# Patient Record
Sex: Female | Born: 1965
Health system: Southern US, Community
[De-identification: ages and names within clinical notes are randomized; demographics above are authoritative.]

---

## 1999-08-09 ENCOUNTER — Other Ambulatory Visit: Admission: RE | Admit: 1999-08-09 | Discharge: 1999-08-09 | Payer: Self-pay | Admitting: Obstetrics and Gynecology

## 2000-05-29 ENCOUNTER — Encounter: Admission: RE | Admit: 2000-05-29 | Discharge: 2000-05-29 | Payer: Self-pay | Admitting: Obstetrics and Gynecology

## 2000-05-29 ENCOUNTER — Encounter: Payer: Self-pay | Admitting: Obstetrics and Gynecology

## 2000-08-28 ENCOUNTER — Other Ambulatory Visit: Admission: RE | Admit: 2000-08-28 | Discharge: 2000-08-28 | Payer: Self-pay | Admitting: Obstetrics and Gynecology

## 2000-08-29 ENCOUNTER — Encounter: Admission: RE | Admit: 2000-08-29 | Discharge: 2000-08-29 | Payer: Self-pay | Admitting: Internal Medicine

## 2000-08-29 ENCOUNTER — Encounter: Payer: Self-pay | Admitting: Internal Medicine

## 2000-10-21 ENCOUNTER — Ambulatory Visit (HOSPITAL_COMMUNITY): Admission: RE | Admit: 2000-10-21 | Discharge: 2000-10-21 | Payer: Self-pay | Admitting: Obstetrics and Gynecology

## 2000-10-21 ENCOUNTER — Encounter (INDEPENDENT_AMBULATORY_CARE_PROVIDER_SITE_OTHER): Payer: Self-pay

## 2001-03-30 ENCOUNTER — Encounter: Payer: Self-pay | Admitting: Obstetrics and Gynecology

## 2001-03-30 ENCOUNTER — Encounter: Admission: RE | Admit: 2001-03-30 | Discharge: 2001-03-30 | Payer: Self-pay | Admitting: Obstetrics and Gynecology

## 2001-09-07 ENCOUNTER — Other Ambulatory Visit: Admission: RE | Admit: 2001-09-07 | Discharge: 2001-09-07 | Payer: Self-pay | Admitting: Obstetrics and Gynecology

## 2002-05-22 ENCOUNTER — Ambulatory Visit (HOSPITAL_COMMUNITY): Admission: RE | Admit: 2002-05-22 | Discharge: 2002-05-22 | Payer: Self-pay | Admitting: Obstetrics and Gynecology

## 2002-05-22 ENCOUNTER — Encounter (INDEPENDENT_AMBULATORY_CARE_PROVIDER_SITE_OTHER): Payer: Self-pay

## 2002-07-01 ENCOUNTER — Encounter (INDEPENDENT_AMBULATORY_CARE_PROVIDER_SITE_OTHER): Payer: Self-pay | Admitting: Specialist

## 2002-07-01 ENCOUNTER — Inpatient Hospital Stay (HOSPITAL_COMMUNITY): Admission: RE | Admit: 2002-07-01 | Discharge: 2002-07-03 | Payer: Self-pay | Admitting: Obstetrics and Gynecology

## 2002-10-11 ENCOUNTER — Other Ambulatory Visit: Admission: RE | Admit: 2002-10-11 | Discharge: 2002-10-11 | Payer: Self-pay | Admitting: Obstetrics and Gynecology

## 2005-05-26 ENCOUNTER — Encounter: Admission: RE | Admit: 2005-05-26 | Discharge: 2005-05-26 | Payer: Self-pay | Admitting: Internal Medicine

## 2005-10-24 HISTORY — PX: BREAST BIOPSY: SHX20

## 2006-05-30 ENCOUNTER — Encounter: Admission: RE | Admit: 2006-05-30 | Discharge: 2006-05-30 | Payer: Self-pay | Admitting: Internal Medicine

## 2006-06-02 ENCOUNTER — Encounter: Admission: RE | Admit: 2006-06-02 | Discharge: 2006-06-02 | Payer: Self-pay | Admitting: Internal Medicine

## 2006-06-19 ENCOUNTER — Encounter (INDEPENDENT_AMBULATORY_CARE_PROVIDER_SITE_OTHER): Payer: Self-pay | Admitting: *Deleted

## 2006-06-19 ENCOUNTER — Encounter: Admission: RE | Admit: 2006-06-19 | Discharge: 2006-06-19 | Payer: Self-pay | Admitting: Internal Medicine

## 2007-06-04 ENCOUNTER — Encounter: Admission: RE | Admit: 2007-06-04 | Discharge: 2007-06-04 | Payer: Self-pay | Admitting: Internal Medicine

## 2007-11-22 ENCOUNTER — Emergency Department (HOSPITAL_COMMUNITY): Admission: EM | Admit: 2007-11-22 | Discharge: 2007-11-22 | Payer: Self-pay | Admitting: Emergency Medicine

## 2008-06-04 ENCOUNTER — Encounter: Admission: RE | Admit: 2008-06-04 | Discharge: 2008-06-04 | Payer: Self-pay | Admitting: Internal Medicine

## 2009-06-05 ENCOUNTER — Encounter: Admission: RE | Admit: 2009-06-05 | Discharge: 2009-06-05 | Payer: Self-pay | Admitting: Internal Medicine

## 2010-06-07 ENCOUNTER — Encounter: Admission: RE | Admit: 2010-06-07 | Discharge: 2010-06-07 | Payer: Self-pay | Admitting: Internal Medicine

## 2011-03-11 NOTE — Discharge Summary (Signed)
   NAME:  Samantha Cardenas, Samantha Cardenas                      ACCOUNT NO.:  [id number]   MEDICAL RECORD NO.:  [id number]                   PATIENT TYPE:  INP   LOCATION:  9327                                 FACILITY:  [facility]   PHYSICIAN:  Lenoard Aden, M.D.             DATE OF BIRTH:  1965/10/29   DATE OF ADMISSION:  07/01/2002  DATE OF DISCHARGE:  07/03/2002                                 DISCHARGE SUMMARY   The patient underwent uncomplicated abdominal myomectomy on July 01, 2002.  Postoperative hemoglobin stable at 9.7.  The patient remained  afebrile.  Blood pressure within normal limits.  She was discharged to home  on postoperative day number two.  Discharge teaching done.  Tylox number 30  and nonsteroidal anti-inflammatories recommended for discharge pain relief.  She is given precautions and told to follow up in the office in two days for  staple removal.                                               Lenoard Aden, M.D.    RJT/MEDQ  D:  07/03/2002  T:  07/03/2002  Job:  [id number]

## 2011-03-11 NOTE — Op Note (Signed)
   NAME:  Samantha Cardenas, Samantha Cardenas                      ACCOUNT NO.:  [id number]   MEDICAL RECORD NO.:  [id number]                   PATIENT TYPE:  AMB   LOCATION:  SDC                                  FACILITY:  [facility]   PHYSICIAN:  Lenoard Aden, M.D.             DATE OF BIRTH:  1966/08/15   DATE OF PROCEDURE:  05/22/2002  DATE OF DISCHARGE:  05/22/2002                                 OPERATIVE REPORT   PREOPERATIVE DIAGNOSES:  1. Missed abortion and embryonic fetal demise.  2. Multiple uterine fibroids.   POSTOPERATIVE DIAGNOSES:  1. Missed abortion and embryonic fetal demise.  2. Multiple uterine fibroids.   PROCEDURE:  Suction dilatation and evacuation.   SURGEON:  Lenoard Aden, M.D.   ANESTHESIA:  General by Raul Del, M.D.   ESTIMATED BLOOD LOSS:  Less than 50 cc.   COMPLICATIONS:  None.   COUNTS:  Needle counts correct.   DISPOSITION:  Patient to recovery in good condition.   SPECIMENS:  Products of conception sent to pathology.   DESCRIPTION OF PROCEDURE:  After being apprised of the risks of anesthesia,  infection, bleeding, uterine perforation and need for repair, the patient  was brought to the operating room, where she was administered a general  anesthetic without complications, prepped and draped in the usual sterile  fashion and catheterized until the bladder was empty.  Exam under anesthesia  revealed a bulky 10-week size uterus with anterior irregularities consistent  with fibroids, no evidence of masses.  After achieving adequate anesthesia,  a paracervical block was placed using 20 cc of dilute Xylocaine solution.  The uterus sounds to approximately 14 cm, dilated easily up to a #24 Pratt  dilator, and an 8 mm suction curette placed, suctioned revealing adequate  tissue consistent with products of conception, which is noted.  Good  hemostasis was achieved.  Blunt curettage in a four-quadrant method and  repeat suction and curettage reveals the  cavity to be empty.  Good  hemostasis is achieved with additional bimanual massage.  All instruments  are removed from the vagina.  The patient is awakened, transferred to  recovery in good condition.                                                Lenoard Aden, M.D.   RJT/MEDQ  D:  05/22/2002  T:  05/28/2002  Job:  [id number]

## 2011-03-11 NOTE — H&P (Signed)
   NAME:  Samantha Cardenas, Samantha Cardenas                      ACCOUNT NO.:  [id number]   MEDICAL RECORD NO.:  [id number]                   PATIENT TYPE:  AMB   LOCATION:  SDC                                  [facility]ILITY[facility]  [facility]   PHYSICIAN:  Olivia Mackie, M.D.                DATE OF BIRTH:  [birth year]   DATE OF ADMISSION:  05/22/2002  DATE OF DISCHARGE:                                HISTORY & PHYSICAL   CHIEF COMPLAINT:  Missed abortion.   HISTORY OF PRESENT ILLNESS:  The patient is a 45 year old African American  female G3, P1, with missed AB, six week intrauterine sac for definitive  therapy.   PAST MEDICAL HISTORY:  Remarkable for hypertension, currently on Aldomet 250  mg twice a day.  She has no history of any other surgical hospitalizations.  History of one uncomplicated abortion and one uncomplicated vaginal birth.   ALLERGIES:  ENVIRONMENTAL in addition to ATENOLOL and LABETALOL.   FAMILY HISTORY:  Diabetes, hypertension, and breasts cancer.   PHYSICAL EXAMINATION:  GENERAL:  She is a well-developed and well-nourished  [place] female in no apparent distress.  HEENT:  Normal.  LUNGS:  Clear.  HEART:  Regular.  ABDOMEN:  Soft, gravid, and nontender.  PELVIC:  Uterus is anteflexed, irregular, and 10 weeks size.  No adnexal  masses.  EXTREMITIES:  There are no cords.  NEUROLOGIC:  Exam is nonfocal.   IMPRESSION:  Missed abortion with subnormal rising human chorionic  gonadotropin titers.   PLAN:  Proceed with suction D&E.  Risks of anesthesia, infection, bleeding,  injury to abdominal organs, and need for repair were discussed at length  with complications to include bowel, bladder, and __________ .  The patient  acknowledges and wishes to proceed.                                                Olivia Mackie, M.D.    RT/MEDQ  D:  05/22/2002  T:  05/23/2002  Job:  [id number] [id number]

## 2011-03-11 NOTE — H&P (Signed)
[facility]  Patient:    Samantha Cardenas, Samantha Cardenas                   MRN: [id number] Adm. Date:  [id number] Attending:  Lenoard Aden CC:         [facility]   History and Physical  CHIEF COMPLAINT:              Menometrorrhagia, questionable submucous.  HISTORY OF PRESENT ILLNESS:   The patient is a 45 year old black female, G1, P1, with known uterine fibroids and questionable submucous fibroid.  She presents for definitive therapy.  ALLERGIES:  No known drug allergies.  MEDICATIONS: 1. Micardis HCT. 2. Allegra. 3. Calcium. 4. Levbid as need.  PAST MEDICAL HISTORY:         Hypertension.  Known irritable bowel syndrome.  SURGICAL HISTORY:             Carpal tunnel release.  Vaginal delivery.  SOCIAL HISTORY:               She denies domestic or physical violence. Social history otherwise noncontributory.  FAMILY HISTORY:               Noncontributory.  PHYSICAL EXAMINATION:  GENERAL:                      A well-developed, well-nourished black female. No apparent distress.  HEENT:                        Normal.  LUNGS:                        Clear.  HEART:                        Regular rhythm.  ABDOMEN:                      Soft, obese and nontender.  PELVIC:                       Uterus is slightly enlarged, anteflexed.  No adnexal masses are appreciated.  EXTREMITIES:                  Reveal no cords.  NEUROLOGIC:                   Nonfocal.  IMPRESSION:                   Menometrorrhagia with probable submucous fibroid.  PLAN:                         Proceed with diagnostic hysteroscopy and resectoscope.  D&C.  The risks of anesthesia, infection, bleeding, uterine perforation, need for repair was discussed.  The patient acknowledges and desires to proceed. DD:  10/21/00 TD:  10/21/00 Job: [id number]

## 2011-03-11 NOTE — Op Note (Signed)
[facility]  Patient:    Samantha Cardenas, Samantha Cardenas                   MRN: [id number] Proc. Date: 10/21/00 Adm. Date:  [id number] Attending:  Lenoard Aden CC:         [facility]   Operative Report  PREOPERATIVE DIAGNOSIS:       Menometrorrhagia with probable submucous fibroid.  POSTOPERATIVE DIAGNOSIS:      Menometrorrhagia with probable submucous fibroid.  PROCEDURES:                   1. Diagnosed hysteroscopy.                               2. Resectoscopic myomectomy.                               3. Dilation and curettage.  SURGEON:                      Lenoard Aden, M.D.  ANESTHESIA:                   General.  ESTIMATED BLOOD LOSS:         less than 50 cc.  FLUID DEFICIT:                None.  COMPLICATIONS:                None.  SPECIMENS:                    Submucous fibroid and endometrial curettings to pathology.  DISPOSITION:                  Patient to recovery in good condition.  BRIEF OPERATIVE NOTE:         After being apprised of the risks of anesthesia, infection, bleeding and uterine perforation with the need for repair, the patient was brought to the operating room, where she was administered general anesthetic, prepped and draped in the usual sterile fashion.  She catheterized until her bladder was empty.  Her feet were placed in the [place] stirrups. Examination under anesthesia revealed a boggy, enlarged, anteflexed uterus and no adnexal masses.  A weighted speculum was placed. Diluted Pitressin solution was placed at 3 and 9 oclock at the cervicovaginal junction and the cervix dilated easily up to a #33 Pratt dilator.  The hysteroscope was placed. Diffuse endometrial thickening on the anterior and posterior wall with a bulging along the anterior fundal area was noted.  A double edged loop was used to resect the posterior wall, anterior wall and the submucous fibroid, which appeared to be in the anterior  fundal area.  Bilateral tubal ostial appeared normal.  Good hemostasis was noted during the procedure.  Fluid deficit, as noted, was 0.  The instruments were removed.  D&C was performed with a sharp curet.  Revisualization revealed an empty cavity.  After this, all instruments were removed.  The patient tolerated the procedure well and was transferred to recovery in good condition. DD:  10/21/00 TD:  10/21/00 Job: [id number]

## 2011-03-11 NOTE — H&P (Signed)
   NAME:  Samantha Cardenas, Samantha Cardenas                      ACCOUNT NO.:  [id number]   MEDICAL RECORD NO.:  [id number]                   PATIENT TYPE:  INP   LOCATION:  NA                                   [facility]ACILITY:  [facility]   PHYSICIAN:  Lenoard Aden, M.D.             DATE OF BIRTH:  27-Jun-1966   DATE OF ADMISSION:  07/01/2002  DATE OF DISCHARGE:                                HISTORY & PHYSICAL   CHIEF COMPLAINT:  Symptomatic uterine fibroids, dysmenorrhea and pelvic  pain.   HISTORY OF PRESENT ILLNESS:  The patient is a 45 year old African-American  female G3, P1, status post history of recurrent pregnancy loss who presents  with symptomatic enlarging uterine fibroids.  Past ultrasound reveals a 7 cm  fundal bordering on some mucosal fibroid with abnormal saline fundal  hysterography followed by pregnancy with secondary loss.   PAST OBSTETRICAL AND GYNECOLOGICAL HISTORY:  The patient's past medical  history is significant for a submucous fibroid with resection and  resectoscopic myomectomy in December 2001.  Previously she has had one  uncomplicated abortion and one uncomplicated vaginal delivery.   MEDICATIONS:  Include previous Micardis for hypertension, Claritin and  prenatal p.r.n.   ALLERGIES:  She is allergic to pollen.   PAST MEDICAL HISTORY:  Remarkable for history of hypertension for which she  has previously been on Micardis and now has switched to Aldomet as a chronic  medication due to preconceptional counseling.   PHYSICAL EXAMINATION:   GENERAL:  On physical exam she is a well-developed, well-nourished African-  American female in no apparent distress.   HEENT:  Normal.   LUNGS:  Clear.   HEART:  Regular rate and rhythm.   ABDOMEN:  Soft and nontender.   PELVIC:  Exam reveals an anteflexed 12-13 cm size uterus.  No adnexal masses  as confirmed by ultrasound.   EXTREMITIES:  Reveal no cords.   NEUROLOGIC:  Exam is nonfocal.   IMPRESSION:  1. Recurrent  pregnancy loss.  2. Symptomatic dysmenorrhea and menorrhagia.  3. Pelvic pain.  4. Known enlarging uterine fibroid demonstrated.   PLAN:  The plan is to proceed with exploratory laparotomy and abdominal  myomectomy.  The risks of  anesthesia, infection, bleeding, and ability to  prevent inability in future conception discussed.  A small possibility of  causing infertility from adhesions related  to the procedure was done.  The  patient acknowledges and desires to proceed.                                                  Lenoard Aden, M.D.    RJT/MEDQ  D:  06/30/2002  T:  07/01/2002  Job:  [id number]   cc:   [facility]

## 2011-03-11 NOTE — Op Note (Signed)
NAME:  MATTINGLY, FOUNTAINE                      ACCOUNT NO.:  [id number]   MEDICAL RECORD NO.:  [id number]                   PATIENT TYPE:  INP   LOCATION:  9327                                 [facility]ACILITY:  [facility]   PHYSICIAN:  Fatih Stalvey DICTATOR                    DATE OF BIRTH:  Sep 10, 1966   DATE OF PROCEDURE:  07/01/2002  DATE OF DISCHARGE:                                 OPERATIVE REPORT   PREOPERATIVE DIAGNOSES:  1. Dysmenorrhea.  2. Symptomatic fibroids.   POSTOPERATIVE DIAGNOSES:  1. Dysmenorrhea.  2. Symptomatic fibroids.   PROCEDURES:  1. Exploratory laparotomy.  2. Myomectomy.   SURGEON:  Lenoard Aden, M.D.   ASSISTANT:  Pershing Cox, M.D.   ANESTHESIA:  General by Quillian Quince, M.D.   ESTIMATED BLOOD LOSS:  75 cc.   COMPLICATIONS:  None.   INSTRUMENT COUNT:  Correct.   DISPOSITION:  The patient in recovery in good condition.   SPECIMENS:  Uterine fibroid, to pathology.   BRIEF OPERATIVE NOTE:  After being apprised of the risks of anesthesia,  infection, bleeding, possible need for hysterectomy due to uncontrolled  intraoperative bleeding upon removal of fibroid, possible recurrent risk of  fibroid, possible infertility from secondary adhesions, the patient was  brought to the operating room where she was administered general anesthetic  without complications, prepped and draped in the usual sterile fashion.  Please note that all consents were signed and explained prior to the  procedure.  At this time, after achieving adequate anesthesia, exam under  anesthesia reveals a bulky, anteflexed, 10-12 weeks' size uterus.  No  adnexal masses.  A HUMI was placed per vagina and hooked up to dye in a  sterile fashion, indigo carmine.  At this time, a Pfannenstiel skin incision  made with a scalpel which was carried down to the fascia then nicked in the  midline and opened transversely using Mayo scissors.  The rectus muscles  dissected sharply in the  midline.  The peritoneum entered sharply.  The  bowel was packed out of the operative field.  Pulsatile aorta was noted, no  adenopathy.  The appendix, liver, and gallbladder are not visualized.  Normal tubes and ovaries are visualized.  A anterior,  bulging, consistent  with a intramural fibroid is noted.  An anterior transverse fundal incision  is made after dilute Pitressin solution, 20 and 30 cc, is placed.  Capsule  of the fibroid is difficult to identify and is difficult to dissect off of  the uterine capsule.  Sharp dissection is undertaken using Metzenbaum  scissors.  Good hemostasis during dissection is noted.  The fibroid is  elevated using towel clips.  After achieving dissection around what appears  to be half way around this poorly defined fibroid, the decision is made to  bisect the fibroid so as to identify where it comes in contact with the  endometrial cavity because  on saline sonohysterogram, there is a suspicion  that it enters the endometrial cavity.  Therefore, bisecting the fibroid  down to its base reveals where it attaches into the endometrial cavity.  The  fibroid is then sharply dissected off its caudad point and the endometrial  cavity is entered.  Indigo carmine blue is then injected through the HUMI to  identify the endometrial cavity defect which is then closed using a 3-0  Vicryl suture in interrupted fashion.  The fibroid having been removed, the  base is hemostatic, and is imbricated and closed using multiple interrupted  3-0 Vicryl sutures.  Good hemostasis is noted.  A baseball stitch is placed  in three locations to close the defect using a 4-0 Monocryl suture.  Good  hemostasis achieved.  Copious irrigation with 500 cc is provided.  Interceed  is placed over the anterior wall defect without difficulty.  Of note, after  closure of the defect, the bladder flap appears to be elevated and adherent  to the lower part of the defect, which will be of note in  future need for C-  sections.  At this time, having accomplished good irrigation and good  hemostasis, the fascia is closed using a 0 Vicryl in continuous running  fashion.  The skin closed using staples.  The patient tolerated the  procedure well and is transferred to recovery in good condition.                                                Nalda Shackleford DICTATOR    DD/MEDQ  D:  07/01/2002  T:  07/01/2002  Job:  [id number]

## 2011-03-31 ENCOUNTER — Other Ambulatory Visit: Payer: Self-pay | Admitting: Internal Medicine

## 2011-03-31 DIAGNOSIS — Z1231 Encounter for screening mammogram for malignant neoplasm of breast: Secondary | ICD-10-CM

## 2011-06-09 ENCOUNTER — Ambulatory Visit
Admission: RE | Admit: 2011-06-09 | Discharge: 2011-06-09 | Disposition: A | Discharge status: Home / Self Care | Payer: 59 | Source: Ambulatory Visit | Attending: Internal Medicine | Admitting: Internal Medicine

## 2011-06-09 DIAGNOSIS — Z1231 Encounter for screening mammogram for malignant neoplasm of breast: Secondary | ICD-10-CM

## 2013-05-21 ENCOUNTER — Other Ambulatory Visit: Payer: Self-pay

## 2013-05-21 DIAGNOSIS — Z1231 Encounter for screening mammogram for malignant neoplasm of breast: Secondary | ICD-10-CM

## 2013-06-04 ENCOUNTER — Ambulatory Visit: Payer: 59

## 2013-06-11 ENCOUNTER — Ambulatory Visit
Admission: RE | Admit: 2013-06-11 | Discharge: 2013-06-11 | Disposition: A | Discharge status: Home / Self Care | Payer: BC Managed Care – PPO | Source: Ambulatory Visit

## 2013-06-11 DIAGNOSIS — Z1231 Encounter for screening mammogram for malignant neoplasm of breast: Secondary | ICD-10-CM

## 2014-06-02 ENCOUNTER — Other Ambulatory Visit: Payer: Self-pay

## 2014-06-02 DIAGNOSIS — Z1231 Encounter for screening mammogram for malignant neoplasm of breast: Secondary | ICD-10-CM

## 2014-06-12 ENCOUNTER — Ambulatory Visit
Admission: RE | Admit: 2014-06-12 | Discharge: 2014-06-12 | Disposition: A | Discharge status: Home / Self Care | Payer: PRIVATE HEALTH INSURANCE | Source: Ambulatory Visit

## 2014-06-12 ENCOUNTER — Encounter (INDEPENDENT_AMBULATORY_CARE_PROVIDER_SITE_OTHER): Payer: Self-pay

## 2014-06-12 DIAGNOSIS — Z1231 Encounter for screening mammogram for malignant neoplasm of breast: Secondary | ICD-10-CM

## 2014-10-23 DIAGNOSIS — R7309 Other abnormal glucose: Secondary | ICD-10-CM | POA: Insufficient documentation

## 2015-04-30 ENCOUNTER — Other Ambulatory Visit: Payer: Self-pay

## 2015-04-30 DIAGNOSIS — Z1231 Encounter for screening mammogram for malignant neoplasm of breast: Secondary | ICD-10-CM

## 2015-06-15 ENCOUNTER — Ambulatory Visit
Admission: RE | Admit: 2015-06-15 | Discharge: 2015-06-15 | Disposition: A | Discharge status: Home / Self Care | Payer: PRIVATE HEALTH INSURANCE | Source: Ambulatory Visit

## 2015-06-15 ENCOUNTER — Other Ambulatory Visit: Payer: Self-pay

## 2015-06-15 DIAGNOSIS — Z1231 Encounter for screening mammogram for malignant neoplasm of breast: Secondary | ICD-10-CM

## 2015-06-17 ENCOUNTER — Other Ambulatory Visit: Payer: Self-pay | Admitting: Internal Medicine

## 2015-06-17 DIAGNOSIS — R928 Other abnormal and inconclusive findings on diagnostic imaging of breast: Secondary | ICD-10-CM

## 2015-06-23 ENCOUNTER — Ambulatory Visit
Admission: RE | Admit: 2015-06-23 | Discharge: 2015-06-23 | Disposition: A | Discharge status: Home / Self Care | Payer: PRIVATE HEALTH INSURANCE | Source: Ambulatory Visit | Attending: Internal Medicine | Admitting: Internal Medicine

## 2015-06-23 DIAGNOSIS — R928 Other abnormal and inconclusive findings on diagnostic imaging of breast: Secondary | ICD-10-CM

## 2015-12-10 ENCOUNTER — Other Ambulatory Visit: Payer: Self-pay | Admitting: Internal Medicine

## 2015-12-10 DIAGNOSIS — R921 Mammographic calcification found on diagnostic imaging of breast: Secondary | ICD-10-CM

## 2015-12-10 DIAGNOSIS — N6001 Solitary cyst of right breast: Secondary | ICD-10-CM

## 2015-12-16 ENCOUNTER — Ambulatory Visit
Admission: RE | Admit: 2015-12-16 | Discharge: 2015-12-16 | Disposition: A | Discharge status: Home / Self Care | Payer: PRIVATE HEALTH INSURANCE | Source: Ambulatory Visit | Attending: Internal Medicine | Admitting: Internal Medicine

## 2015-12-16 ENCOUNTER — Other Ambulatory Visit: Payer: Self-pay | Admitting: Internal Medicine

## 2015-12-16 DIAGNOSIS — N6001 Solitary cyst of right breast: Secondary | ICD-10-CM

## 2015-12-16 DIAGNOSIS — R921 Mammographic calcification found on diagnostic imaging of breast: Secondary | ICD-10-CM

## 2016-06-15 ENCOUNTER — Ambulatory Visit
Admission: RE | Admit: 2016-06-15 | Discharge: 2016-06-15 | Disposition: A | Discharge status: Home / Self Care | Payer: PRIVATE HEALTH INSURANCE | Source: Ambulatory Visit

## 2016-06-15 DIAGNOSIS — Z1231 Encounter for screening mammogram for malignant neoplasm of breast: Secondary | ICD-10-CM

## 2017-05-19 ENCOUNTER — Other Ambulatory Visit: Payer: Self-pay | Admitting: Internal Medicine

## 2017-05-19 DIAGNOSIS — Z1231 Encounter for screening mammogram for malignant neoplasm of breast: Secondary | ICD-10-CM

## 2017-06-11 DIAGNOSIS — M25462 Effusion, left knee: Secondary | ICD-10-CM | POA: Insufficient documentation

## 2017-06-19 ENCOUNTER — Ambulatory Visit
Admission: RE | Admit: 2017-06-19 | Discharge: 2017-06-19 | Disposition: A | Discharge status: Home / Self Care | Payer: PRIVATE HEALTH INSURANCE | Source: Ambulatory Visit | Attending: Internal Medicine | Admitting: Internal Medicine

## 2017-06-19 DIAGNOSIS — Z1231 Encounter for screening mammogram for malignant neoplasm of breast: Secondary | ICD-10-CM

## 2017-10-09 MED FILL — ROSUVASTATIN CALCIUM 10 MG: 10 | 30 days supply | Qty: 30 | Fill #0

## 2017-10-09 MED FILL — AMLODIPINE-BENAZEPRIL 5-10: 5-10 | 90 days supply | Qty: 90 | Fill #0

## 2017-10-09 MED FILL — TRIAMTERENE-HCTZ 37.5-25 MG: 37.5-25 | 30 days supply | Qty: 30 | Fill #0

## 2017-11-02 DIAGNOSIS — E669 Obesity, unspecified: Secondary | ICD-10-CM | POA: Diagnosis not present

## 2017-11-02 DIAGNOSIS — Z0000 Encounter for general adult medical examination without abnormal findings: Secondary | ICD-10-CM | POA: Diagnosis not present

## 2017-11-02 DIAGNOSIS — I10 Essential (primary) hypertension: Secondary | ICD-10-CM | POA: Diagnosis not present

## 2017-11-02 DIAGNOSIS — E559 Vitamin D deficiency, unspecified: Secondary | ICD-10-CM | POA: Diagnosis not present

## 2017-11-02 DIAGNOSIS — R7309 Other abnormal glucose: Secondary | ICD-10-CM | POA: Diagnosis not present

## 2017-11-02 DIAGNOSIS — E785 Hyperlipidemia, unspecified: Secondary | ICD-10-CM | POA: Diagnosis not present

## 2017-11-09 MED FILL — ROSUVASTATIN CALCIUM 10 MG: 10 | 90 days supply | Qty: 90 | Fill #0

## 2017-11-09 MED FILL — TRIAMTERENE-HCTZ 37.5-25 MG: 37.5-25 | 90 days supply | Qty: 90 | Fill #0

## 2018-01-08 MED FILL — AMLODIPINE-BENAZEPRIL 5-10: 5-10 | 90 days supply | Qty: 90 | Fill #1

## 2018-02-05 MED FILL — TRIAMTERENE-HCTZ 37.5-25 MG: 37.5-25 | 90 days supply | Qty: 90 | Fill #1

## 2018-02-06 MED FILL — ROSUVASTATIN CALCIUM 10 MG: 10 | 90 days supply | Qty: 90 | Fill #1

## 2018-04-06 MED FILL — AMLODIPINE-BENAZEPRIL 5-10: 5-10 | 90 days supply | Qty: 90 | Fill #2

## 2018-05-08 MED FILL — ROSUVASTATIN CALCIUM 10 MG: 10 | 90 days supply | Qty: 90 | Fill #2

## 2018-05-09 DIAGNOSIS — E785 Hyperlipidemia, unspecified: Secondary | ICD-10-CM | POA: Diagnosis not present

## 2018-05-09 DIAGNOSIS — H5203 Hypermetropia, bilateral: Secondary | ICD-10-CM | POA: Diagnosis not present

## 2018-05-09 DIAGNOSIS — L989 Disorder of the skin and subcutaneous tissue, unspecified: Secondary | ICD-10-CM | POA: Diagnosis not present

## 2018-05-09 DIAGNOSIS — H524 Presbyopia: Secondary | ICD-10-CM | POA: Diagnosis not present

## 2018-05-09 DIAGNOSIS — I10 Essential (primary) hypertension: Secondary | ICD-10-CM | POA: Diagnosis not present

## 2018-05-09 DIAGNOSIS — R7309 Other abnormal glucose: Secondary | ICD-10-CM | POA: Diagnosis not present

## 2018-05-09 MED FILL — TRIAMTERENE/HCTZ 37.5/25 TB: 37.5-25 | 90 days supply | Qty: 90 | Fill #0

## 2018-05-16 ENCOUNTER — Other Ambulatory Visit: Payer: Self-pay | Admitting: Internal Medicine

## 2018-05-16 DIAGNOSIS — Z1231 Encounter for screening mammogram for malignant neoplasm of breast: Secondary | ICD-10-CM

## 2018-06-05 DIAGNOSIS — D179 Benign lipomatous neoplasm, unspecified: Secondary | ICD-10-CM | POA: Diagnosis not present

## 2018-06-08 MED FILL — AMLODIPINE-BENAZEPRIL 10-20: 10-20 | 90 days supply | Qty: 90 | Fill #0

## 2018-06-21 ENCOUNTER — Ambulatory Visit
Admission: RE | Admit: 2018-06-21 | Discharge: 2018-06-21 | Disposition: A | Discharge status: Home / Self Care | Payer: 59 | Source: Ambulatory Visit | Attending: Internal Medicine | Admitting: Internal Medicine

## 2018-06-21 DIAGNOSIS — Z1231 Encounter for screening mammogram for malignant neoplasm of breast: Secondary | ICD-10-CM | POA: Diagnosis not present

## 2018-06-28 DIAGNOSIS — D171 Benign lipomatous neoplasm of skin and subcutaneous tissue of trunk: Secondary | ICD-10-CM | POA: Diagnosis not present

## 2018-06-28 DIAGNOSIS — D225 Melanocytic nevi of trunk: Secondary | ICD-10-CM | POA: Diagnosis not present

## 2018-08-06 MED FILL — TRIAMTERENE/HCTZ 37.5/25 TB: 37.5-25 | 90 days supply | Qty: 90 | Fill #1

## 2018-08-06 MED FILL — ROSUVASTATIN CALCIUM 10 MG: 10 | 90 days supply | Qty: 90 | Fill #1

## 2018-09-05 MED FILL — AMLODIPINE-BENAZEPRIL 10-20: 10-20 | 90 days supply | Qty: 90 | Fill #1

## 2018-11-01 MED FILL — TRIAMTERENE/HCTZ 37.5/25 TB: 37.5-25 | 90 days supply | Qty: 90 | Fill #2

## 2018-11-01 MED FILL — ROSUVASTATIN CALCIUM 10 MG: 10 | 90 days supply | Qty: 90 | Fill #0

## 2018-11-05 DIAGNOSIS — G4700 Insomnia, unspecified: Secondary | ICD-10-CM | POA: Diagnosis not present

## 2018-11-05 DIAGNOSIS — I10 Essential (primary) hypertension: Secondary | ICD-10-CM | POA: Diagnosis not present

## 2018-11-05 DIAGNOSIS — Z01419 Encounter for gynecological examination (general) (routine) without abnormal findings: Secondary | ICD-10-CM | POA: Diagnosis not present

## 2018-11-05 DIAGNOSIS — E669 Obesity, unspecified: Secondary | ICD-10-CM | POA: Diagnosis not present

## 2018-11-05 DIAGNOSIS — Z0000 Encounter for general adult medical examination without abnormal findings: Secondary | ICD-10-CM | POA: Diagnosis not present

## 2018-11-05 DIAGNOSIS — R7309 Other abnormal glucose: Secondary | ICD-10-CM | POA: Diagnosis not present

## 2018-11-05 DIAGNOSIS — E559 Vitamin D deficiency, unspecified: Secondary | ICD-10-CM | POA: Diagnosis not present

## 2018-11-05 DIAGNOSIS — E785 Hyperlipidemia, unspecified: Secondary | ICD-10-CM | POA: Diagnosis not present

## 2018-11-05 MED FILL — AMLODIPINE-VALSARTAN 10-160: 10-160 | 90 days supply | Qty: 90 | Fill #0

## 2019-01-28 MED FILL — ROSUVASTATIN CALCIUM 10 MG: 10 | 90 days supply | Qty: 90 | Fill #1

## 2019-01-28 MED FILL — TRIAMTERENE/HCTZ 37.5/25 TB: 37.5-25 | 90 days supply | Qty: 90 | Fill #3

## 2019-01-28 MED FILL — AMLODIPINE-VALSARTAN 10-160: 10-160 | 90 days supply | Qty: 90 | Fill #1

## 2019-01-31 ENCOUNTER — Other Ambulatory Visit: Payer: Self-pay | Admitting: Internal Medicine

## 2019-01-31 DIAGNOSIS — Z1231 Encounter for screening mammogram for malignant neoplasm of breast: Secondary | ICD-10-CM

## 2019-05-02 MED FILL — ROSUVASTATIN CALCIUM 10 MG: 10 | 90 days supply | Qty: 90 | Fill #2

## 2019-05-02 MED FILL — TRIAMTERENE/HCTZ 37.5/25 TB: 37.5-25 | 90 days supply | Qty: 90 | Fill #0

## 2019-05-02 MED FILL — AMLODIPINE-VALSARTAN 10-160: 10-160 | 90 days supply | Qty: 90 | Fill #2

## 2019-05-14 DIAGNOSIS — I10 Essential (primary) hypertension: Secondary | ICD-10-CM | POA: Diagnosis not present

## 2019-05-14 DIAGNOSIS — E785 Hyperlipidemia, unspecified: Secondary | ICD-10-CM | POA: Diagnosis not present

## 2019-05-14 DIAGNOSIS — Z20828 Contact with and (suspected) exposure to other viral communicable diseases: Secondary | ICD-10-CM | POA: Diagnosis not present

## 2019-05-14 DIAGNOSIS — E669 Obesity, unspecified: Secondary | ICD-10-CM | POA: Diagnosis not present

## 2019-06-10 DIAGNOSIS — H5203 Hypermetropia, bilateral: Secondary | ICD-10-CM | POA: Diagnosis not present

## 2019-06-10 DIAGNOSIS — H524 Presbyopia: Secondary | ICD-10-CM | POA: Diagnosis not present

## 2019-06-25 ENCOUNTER — Ambulatory Visit
Admission: RE | Admit: 2019-06-25 | Discharge: 2019-06-25 | Disposition: A | Discharge status: Home / Self Care | Payer: 59 | Source: Ambulatory Visit | Attending: Internal Medicine | Admitting: Internal Medicine

## 2019-06-25 ENCOUNTER — Other Ambulatory Visit: Payer: Self-pay

## 2019-06-25 DIAGNOSIS — Z1231 Encounter for screening mammogram for malignant neoplasm of breast: Secondary | ICD-10-CM

## 2019-06-28 DIAGNOSIS — H43812 Vitreous degeneration, left eye: Secondary | ICD-10-CM | POA: Diagnosis not present

## 2019-08-02 MED FILL — AMLODIPINE-VALSARTAN 10-160: 10-160 | 90 days supply | Qty: 90 | Fill #3

## 2019-08-02 MED FILL — TRIAMTERENE-HCTZ 37.5-25 MG: 37.5-25 | 90 days supply | Qty: 90 | Fill #1

## 2019-08-05 MED FILL — ROSUVASTATIN CALCIUM 10 MG: 10 | 90 days supply | Qty: 90 | Fill #0

## 2019-10-30 MED FILL — ROSUVASTATIN CALCIUM 10 MG: 10 | 90 days supply | Qty: 90 | Fill #1

## 2019-10-30 MED FILL — TRIAMTERENE-HCTZ 37.5-25 MG: 37.5-25 | 90 days supply | Qty: 90 | Fill #2

## 2019-10-31 MED FILL — AMLODIPINE-VALSARTAN 10-160: 10-160 | 90 days supply | Qty: 90 | Fill #0

## 2019-11-06 DIAGNOSIS — Z0001 Encounter for general adult medical examination with abnormal findings: Secondary | ICD-10-CM | POA: Diagnosis not present

## 2019-11-06 DIAGNOSIS — Z0000 Encounter for general adult medical examination without abnormal findings: Secondary | ICD-10-CM | POA: Diagnosis not present

## 2019-11-06 DIAGNOSIS — I10 Essential (primary) hypertension: Secondary | ICD-10-CM | POA: Diagnosis not present

## 2019-11-06 DIAGNOSIS — E669 Obesity, unspecified: Secondary | ICD-10-CM | POA: Diagnosis not present

## 2019-11-06 DIAGNOSIS — Z01411 Encounter for gynecological examination (general) (routine) with abnormal findings: Secondary | ICD-10-CM | POA: Diagnosis not present

## 2019-11-06 DIAGNOSIS — E785 Hyperlipidemia, unspecified: Secondary | ICD-10-CM | POA: Diagnosis not present

## 2019-11-06 DIAGNOSIS — E559 Vitamin D deficiency, unspecified: Secondary | ICD-10-CM | POA: Diagnosis not present

## 2019-11-06 DIAGNOSIS — R7309 Other abnormal glucose: Secondary | ICD-10-CM | POA: Diagnosis not present

## 2019-11-06 MED FILL — AMLODIPINE BESYLATE 10 MG T: 10 | 90 days supply | Qty: 90 | Fill #0

## 2019-11-06 MED FILL — LOSARTAN-HCTZ 100-25 MG TAB: 100-25 | 30 days supply | Qty: 30 | Fill #0

## 2019-12-05 MED FILL — LOSARTAN-HCTZ 100-25 MG TAB: 100-25 | 30 days supply | Qty: 30 | Fill #1

## 2020-01-07 MED FILL — LOSARTAN-HCTZ 100-25 MG TAB: 100-25 | 30 days supply | Qty: 30 | Fill #2

## 2020-02-05 MED FILL — ROSUVASTATIN CALCIUM 10 MG: 10 | 90 days supply | Qty: 90 | Fill #2

## 2020-02-05 MED FILL — AMLODIPINE BESYLATE 10 MG T: 10 | 90 days supply | Qty: 90 | Fill #1

## 2020-02-05 MED FILL — LOSARTAN-HCTZ 100-25 MG TAB: 100-25 | 30 days supply | Qty: 30 | Fill #3

## 2020-04-10 ENCOUNTER — Encounter: Payer: Self-pay | Admitting: Podiatry

## 2020-04-10 ENCOUNTER — Ambulatory Visit: Payer: 59 | Admitting: Podiatry

## 2020-04-10 ENCOUNTER — Other Ambulatory Visit: Payer: Self-pay | Admitting: Podiatry

## 2020-04-10 ENCOUNTER — Ambulatory Visit (INDEPENDENT_AMBULATORY_CARE_PROVIDER_SITE_OTHER): Payer: 59

## 2020-04-10 ENCOUNTER — Other Ambulatory Visit: Payer: Self-pay

## 2020-04-10 VITALS — Temp 97.6°F

## 2020-04-10 DIAGNOSIS — M7672 Peroneal tendinitis, left leg: Secondary | ICD-10-CM

## 2020-04-10 DIAGNOSIS — E559 Vitamin D deficiency, unspecified: Secondary | ICD-10-CM | POA: Insufficient documentation

## 2020-04-10 DIAGNOSIS — E785 Hyperlipidemia, unspecified: Secondary | ICD-10-CM | POA: Insufficient documentation

## 2020-04-10 DIAGNOSIS — E669 Obesity, unspecified: Secondary | ICD-10-CM | POA: Insufficient documentation

## 2020-04-10 DIAGNOSIS — M79672 Pain in left foot: Secondary | ICD-10-CM

## 2020-04-10 DIAGNOSIS — I10 Essential (primary) hypertension: Secondary | ICD-10-CM | POA: Insufficient documentation

## 2020-04-10 MED ORDER — DICLOFENAC SODIUM 75 MG PO TBEC: 75.0000 mg | DELAYED_RELEASE_TABLET | Freq: Two times a day (BID) | ORAL | 2 refills | Status: AC

## 2020-04-10 MED FILL — DICLOFENAC SODIUM 75 MG TAB: 75 | 25 days supply | Qty: 50 | Fill #0

## 2020-04-10 NOTE — Progress Notes (Signed)
° °  Subjective:    Patient ID: Samantha Cardenas, female    DOB: August 10, 1966, 54 y.o.   MRN: [id number]  HPI    Review of Systems  All other systems reviewed and are negative.      Objective:   Physical Exam        Assessment & Plan:

## 2020-04-12 NOTE — Progress Notes (Signed)
Subjective:   Patient ID: Samantha Cardenas, female   DOB: 54 y.o.   MRN: [id number]   HPI Patient presents stating she is getting a lot of pain on the outside of her left foot and does not remember why it started but it is been getting worse over the last 2 months.  She likes to be active and has not been able to due to pain and does not smoke   Review of Systems  All other systems reviewed and are negative.       Objective:  Physical Exam Vitals and nursing note reviewed.  Constitutional:      Appearance: She is well-developed.  Pulmonary:     Effort: Pulmonary effort is normal.  Musculoskeletal:        General: Normal range of motion.  Skin:    General: Skin is warm.  Neurological:     Mental Status: She is alert.     Neurovascular status found to be intact muscle strength is found to be adequate range of motion within normal limits.  Patient is noted to have exquisite discomfort lateral side of the left foot at the insertion of the peroneal tendon into the base of the fifth metatarsal with fluid buildup around the area.  Patient is found to have good digital perfusion well oriented x3 with no muscle strength loss     Assessment:  Acute peroneal tendinitis at insertion base of fifth metatarsal left     Plan:  H&P x-ray reviewed and today I did sterile prep and injected the sheath of the peroneal tendon near insertion 3 mg Dexasone Kenalog 5 mg Xylocaine and applied fascial brace to lift up the lateral foot put on oral anti-inflammatories and instructed on ice shoe gear modifications.  Reappoint if symptoms persist  X-rays indicate slight reactivity around the area no indications of other pathology

## 2020-04-22 ENCOUNTER — Other Ambulatory Visit (HOSPITAL_COMMUNITY): Payer: Self-pay | Admitting: Internal Medicine

## 2020-04-22 MED FILL — AMLODIPINE BESYLATE 10 MG T: 10 | 90 days supply | Qty: 90 | Fill #2

## 2020-04-28 MED FILL — LOSARTAN-HCTZ 100-25 MG TAB: 100-25 | 30 days supply | Qty: 30 | Fill #6

## 2020-04-30 DIAGNOSIS — E785 Hyperlipidemia, unspecified: Secondary | ICD-10-CM | POA: Diagnosis not present

## 2020-04-30 DIAGNOSIS — I10 Essential (primary) hypertension: Secondary | ICD-10-CM | POA: Diagnosis not present

## 2020-04-30 DIAGNOSIS — R7309 Other abnormal glucose: Secondary | ICD-10-CM | POA: Diagnosis not present

## 2020-05-05 MED FILL — AMLODIPINE BESYLATE 10 MG T: 10 | 90 days supply | Qty: 90 | Fill #2

## 2020-05-06 MED FILL — ROSUVASTATIN CALCIUM 10 MG: 10 | 90 days supply | Qty: 90 | Fill #0

## 2020-05-11 ENCOUNTER — Other Ambulatory Visit: Payer: Self-pay | Admitting: Internal Medicine

## 2020-05-11 DIAGNOSIS — Z1231 Encounter for screening mammogram for malignant neoplasm of breast: Secondary | ICD-10-CM

## 2020-06-04 MED FILL — LOSARTAN-HCTZ 100-25 MG TAB: 100-25 | 30 days supply | Qty: 30 | Fill #7

## 2020-06-25 ENCOUNTER — Ambulatory Visit: Payer: 59

## 2020-06-26 ENCOUNTER — Other Ambulatory Visit: Payer: Self-pay

## 2020-06-26 ENCOUNTER — Ambulatory Visit
Admission: RE | Admit: 2020-06-26 | Discharge: 2020-06-26 | Disposition: A | Discharge status: Home / Self Care | Payer: 59 | Source: Ambulatory Visit | Attending: Internal Medicine | Admitting: Internal Medicine

## 2020-06-26 DIAGNOSIS — Z1231 Encounter for screening mammogram for malignant neoplasm of breast: Secondary | ICD-10-CM | POA: Diagnosis not present

## 2020-07-02 MED FILL — LOSARTAN-HCTZ 100-25 MG TAB: 100-25 | 30 days supply | Qty: 30 | Fill #8

## 2020-08-03 MED FILL — ROSUVASTATIN CALCIUM 10 MG: 10 | 90 days supply | Qty: 90 | Fill #1

## 2020-08-03 MED FILL — LOSARTAN-HCTZ 100-25 MG TAB: 100-25 | 30 days supply | Qty: 30 | Fill #9

## 2020-08-03 MED FILL — AMLODIPINE BESYLATE 10 MG T: 10 | 90 days supply | Qty: 90 | Fill #3

## 2020-09-02 MED FILL — LOSARTAN-HCTZ 100-25 MG TAB: 100-25 | 30 days supply | Qty: 30 | Fill #10

## 2020-10-01 MED FILL — LOSARTAN-HCTZ 100-25 MG TAB: 100-25 | 30 days supply | Qty: 30 | Fill #11

## 2020-10-29 ENCOUNTER — Other Ambulatory Visit (HOSPITAL_COMMUNITY): Payer: Self-pay | Admitting: Internal Medicine

## 2020-10-29 MED FILL — LOSARTAN-HCTZ 100-25 MG TAB: 100-25 | 30 days supply | Qty: 30 | Fill #0

## 2020-10-29 MED FILL — AMLODIPINE BESYLATE 10 MG T: 10 | 90 days supply | Qty: 90 | Fill #0

## 2020-10-29 MED FILL — ROSUVASTATIN CALCIUM 10 MG: 10 | 90 days supply | Qty: 90 | Fill #2

## 2021-01-29 ENCOUNTER — Other Ambulatory Visit (HOSPITAL_COMMUNITY): Payer: Self-pay

## 2021-01-29 MED FILL — Rosuvastatin Calcium Tab 10 MG: ORAL | 90 days supply | Qty: 90 | Fill #0 | Status: CN

## 2021-01-29 MED FILL — Amlodipine Besylate Tab 10 MG (Base Equivalent): ORAL | 90 days supply | Qty: 90 | Fill #0 | Status: CN

## 2021-05-28 ENCOUNTER — Other Ambulatory Visit: Payer: Self-pay | Admitting: Internal Medicine

## 2021-05-28 DIAGNOSIS — Z1231 Encounter for screening mammogram for malignant neoplasm of breast: Secondary | ICD-10-CM

## 2021-07-20 ENCOUNTER — Ambulatory Visit
Admission: RE | Admit: 2021-07-20 | Discharge: 2021-07-20 | Disposition: A | Discharge status: Home / Self Care | Payer: No Typology Code available for payment source | Source: Ambulatory Visit | Attending: Internal Medicine | Admitting: Internal Medicine

## 2021-07-20 ENCOUNTER — Other Ambulatory Visit: Payer: Self-pay

## 2021-07-20 DIAGNOSIS — Z1231 Encounter for screening mammogram for malignant neoplasm of breast: Secondary | ICD-10-CM

## 2021-07-30 ENCOUNTER — Ambulatory Visit: Payer: No Typology Code available for payment source | Attending: Internal Medicine

## 2021-07-30 ENCOUNTER — Other Ambulatory Visit (HOSPITAL_BASED_OUTPATIENT_CLINIC_OR_DEPARTMENT_OTHER): Payer: Self-pay

## 2021-07-30 DIAGNOSIS — Z23 Encounter for immunization: Secondary | ICD-10-CM

## 2021-07-30 MED ORDER — INFLUENZA VAC SPLIT QUAD 0.5 ML IM SUSY
PREFILLED_SYRINGE | INTRAMUSCULAR | 0 refills | Status: AC
  Filled 2021-07-30: qty 0.5, 1d supply, fill #0

## 2021-07-30 NOTE — Progress Notes (Signed)
   Covid-19 Vaccination Clinic  Name:  JUSTENE JENSEN    MRN: [id number] DOB: 02/24/1966  07/30/2021  Ms. Fellows was observed post Covid-19 immunization for 15 minutes without incident. She was provided with Vaccine Information Sheet and instruction to access the V-Safe system.   Ms. Louissaint was instructed to call [phone] with any severe reactions post vaccine: Difficulty breathing  Swelling of face and throat  A fast heartbeat  A bad rash all over body  Dizziness and weakness

## 2021-08-10 ENCOUNTER — Other Ambulatory Visit (HOSPITAL_BASED_OUTPATIENT_CLINIC_OR_DEPARTMENT_OTHER): Payer: Self-pay

## 2021-08-10 MED ORDER — PFIZER COVID-19 VAC BIVALENT 30 MCG/0.3ML IM SUSP
INTRAMUSCULAR | 0 refills | Status: AC
  Filled 2021-08-10: qty 0.3, 1d supply, fill #0

## 2021-12-14 ENCOUNTER — Other Ambulatory Visit: Payer: Self-pay | Admitting: Internal Medicine

## 2021-12-14 ENCOUNTER — Ambulatory Visit
Admission: RE | Admit: 2021-12-14 | Discharge: 2021-12-14 | Disposition: A | Discharge status: Home / Self Care | Payer: No Typology Code available for payment source | Source: Ambulatory Visit | Attending: Internal Medicine | Admitting: Internal Medicine

## 2021-12-14 DIAGNOSIS — M25561 Pain in right knee: Secondary | ICD-10-CM

## 2022-05-30 ENCOUNTER — Other Ambulatory Visit: Payer: Self-pay | Admitting: Internal Medicine

## 2022-05-30 DIAGNOSIS — Z1231 Encounter for screening mammogram for malignant neoplasm of breast: Secondary | ICD-10-CM

## 2022-07-22 ENCOUNTER — Ambulatory Visit
Admission: RE | Admit: 2022-07-22 | Discharge: 2022-07-22 | Disposition: A | Discharge status: Home / Self Care | Payer: No Typology Code available for payment source | Source: Ambulatory Visit | Attending: Internal Medicine | Admitting: Internal Medicine

## 2022-07-22 DIAGNOSIS — Z1231 Encounter for screening mammogram for malignant neoplasm of breast: Secondary | ICD-10-CM

## 2023-05-06 IMAGING — CR DG KNEE COMPLETE 4+V*R*
4 series · 4 of 4 positions shown · non-contrast
Comparison: None.

CLINICAL DATA: Right-sided knee pain

EXAM:
RIGHT KNEE - COMPLETE 4+ VIEW

[w knee ap right]
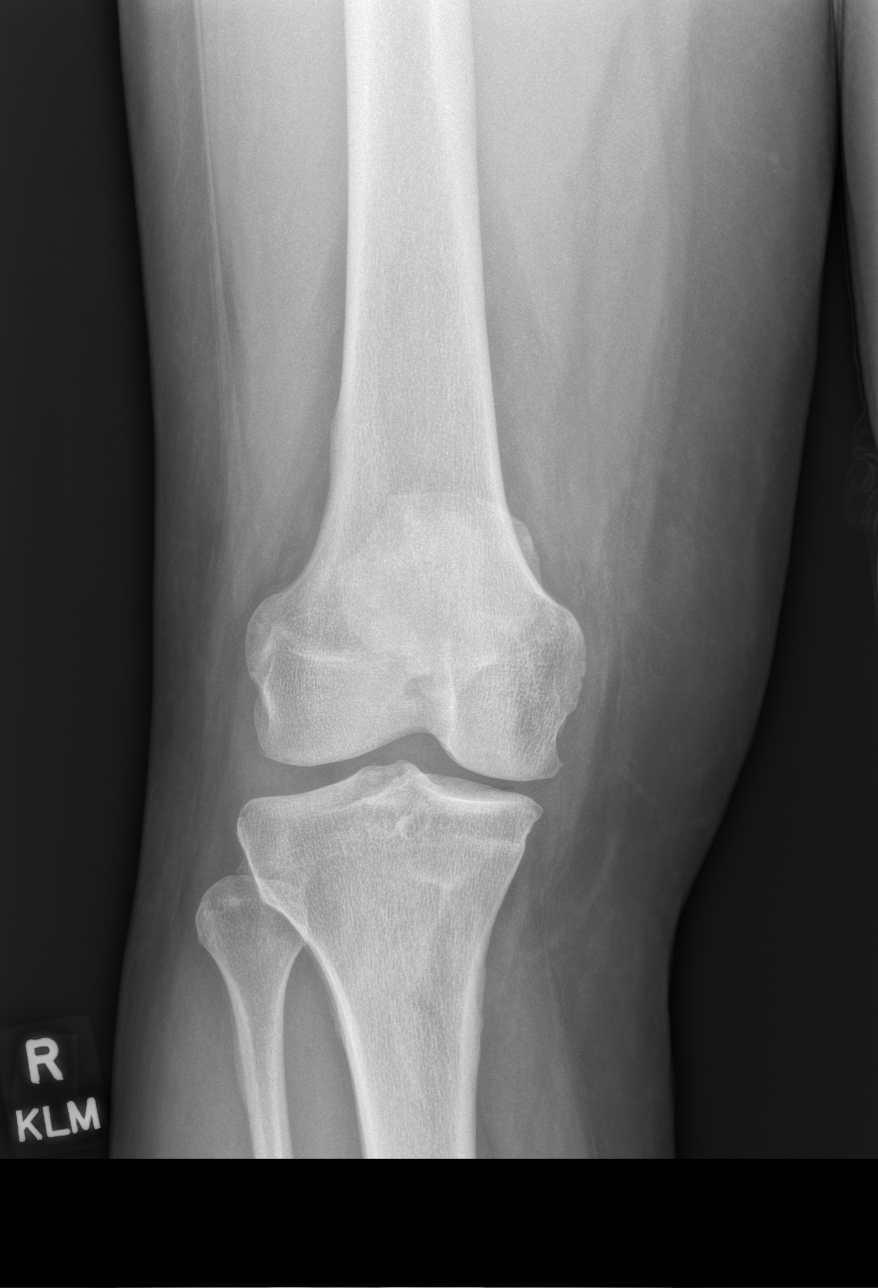

[w knee lat right]
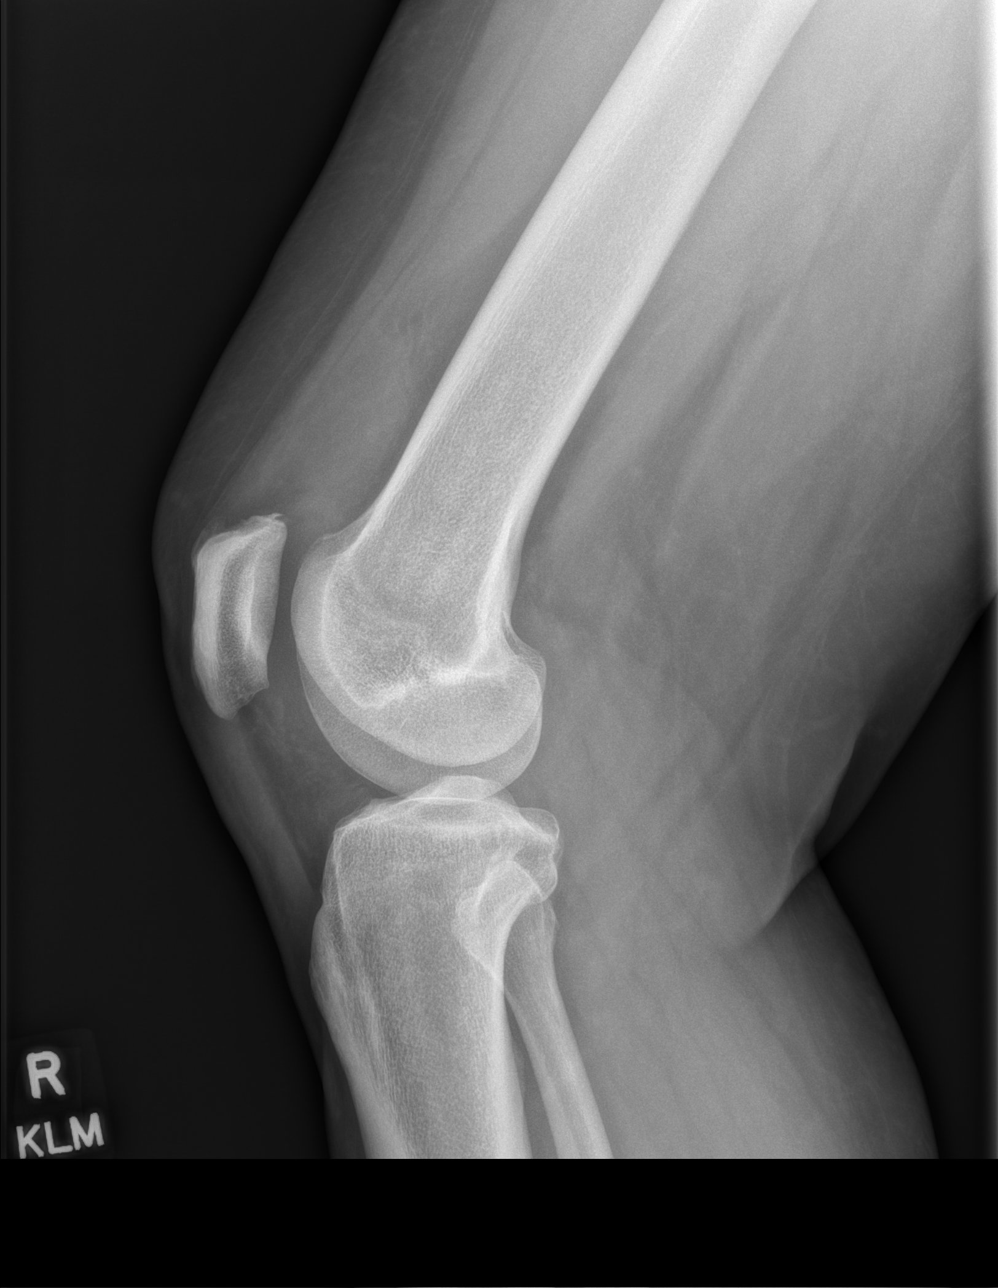

[w knee tunnel pa right]
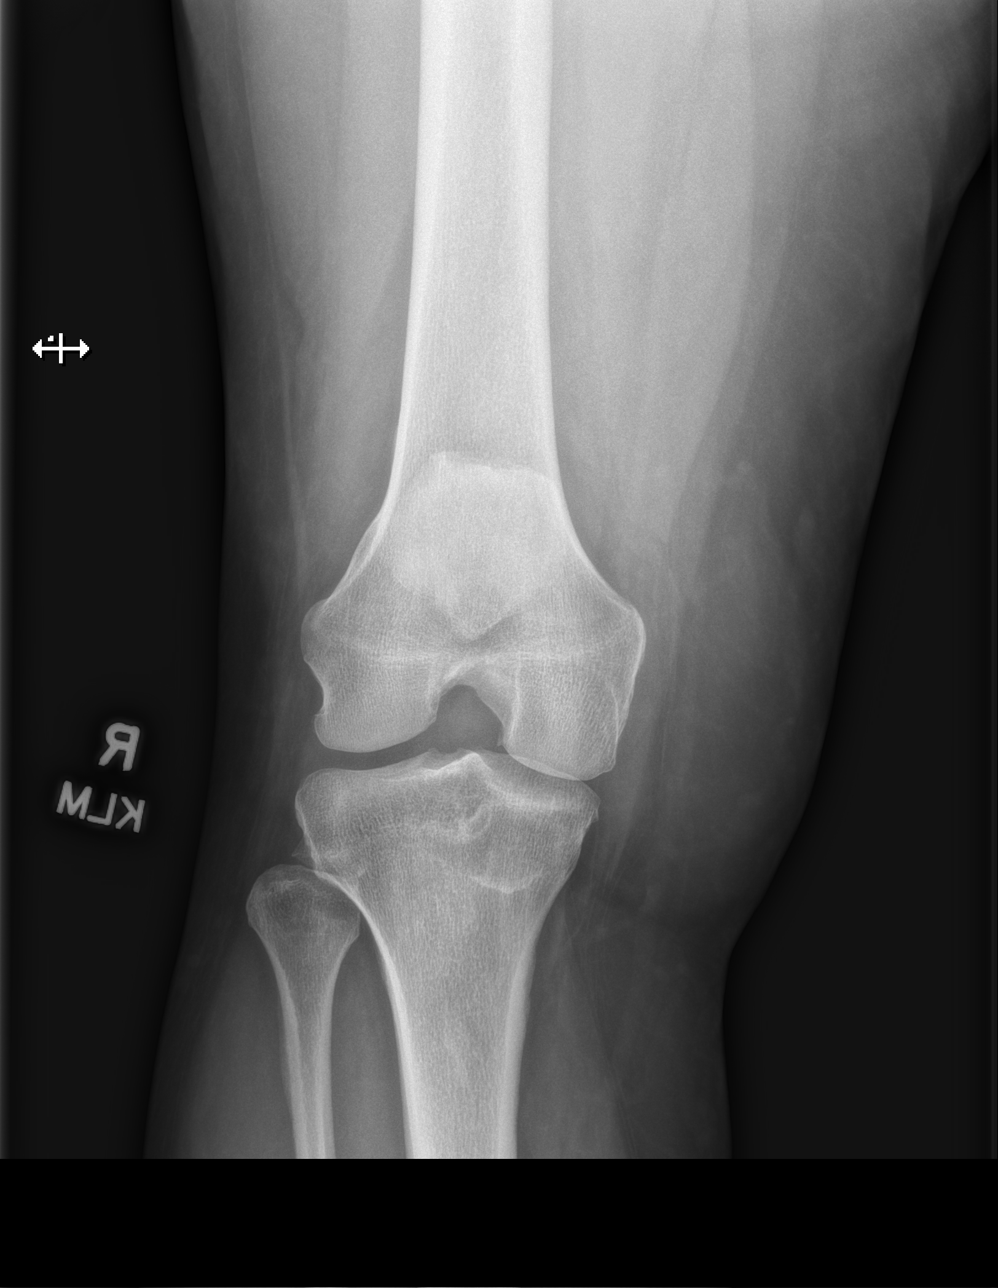

[x knee sunrise right]
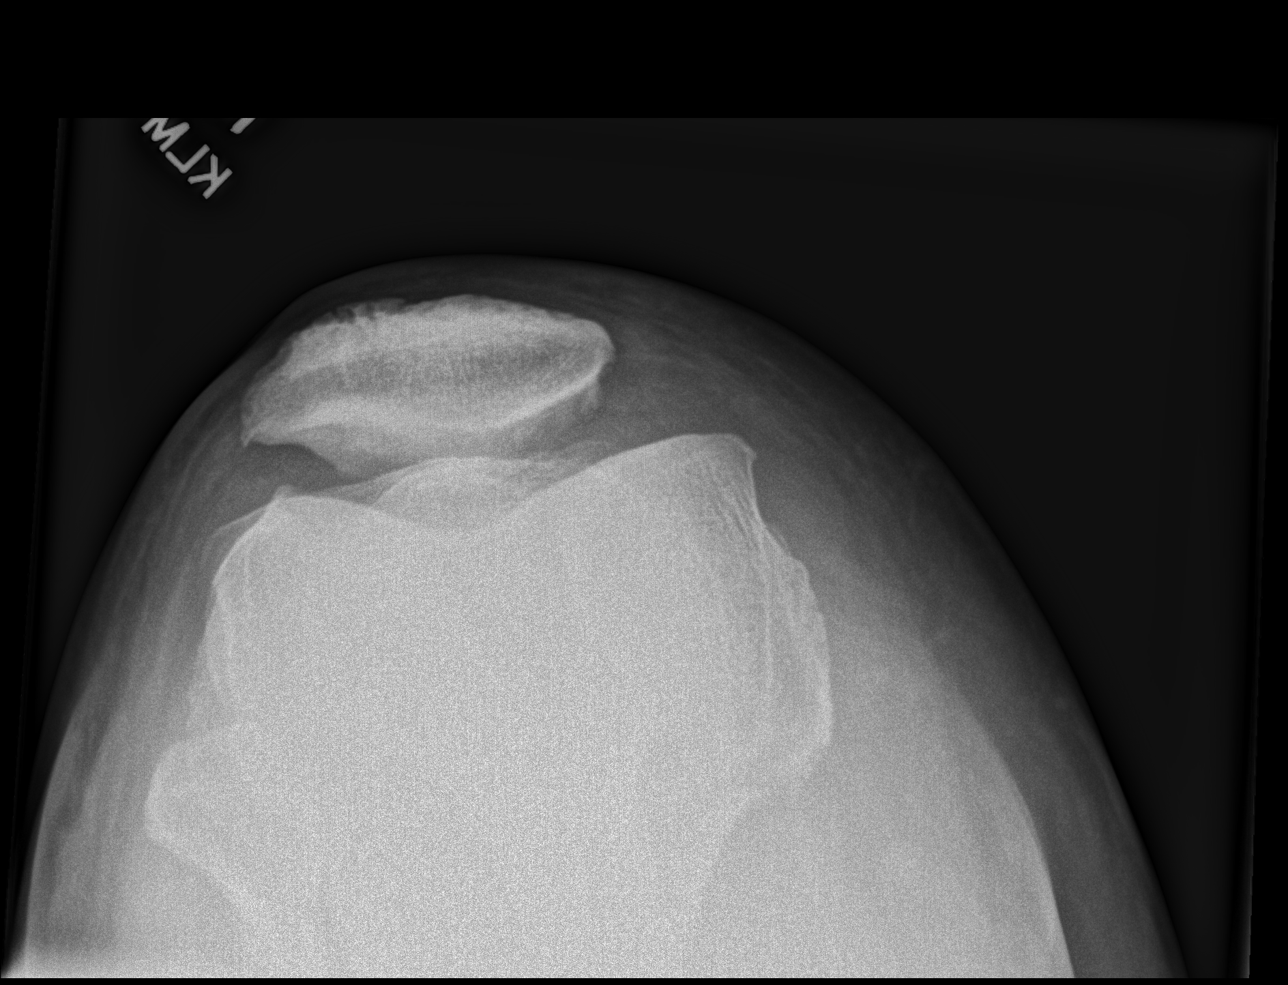

[4 of 4 positions shown; findings below may reference images not displayed]

FINDINGS: No fracture or malalignment. Mild medial and patellofemoral
degenerative change. Moderate knee effusion
IMPRESSION: Mild degenerative changes with moderate knee effusion.

## 2023-06-23 ENCOUNTER — Other Ambulatory Visit: Payer: Self-pay | Admitting: Internal Medicine

## 2023-06-23 DIAGNOSIS — Z1231 Encounter for screening mammogram for malignant neoplasm of breast: Secondary | ICD-10-CM

## 2023-07-25 DIAGNOSIS — Z1231 Encounter for screening mammogram for malignant neoplasm of breast: Secondary | ICD-10-CM

## 2023-08-16 ENCOUNTER — Ambulatory Visit
Admission: RE | Admit: 2023-08-16 | Discharge: 2023-08-16 | Disposition: A | Discharge status: Home / Self Care | Payer: No Typology Code available for payment source | Source: Ambulatory Visit | Attending: Internal Medicine | Admitting: Internal Medicine

## 2023-08-16 DIAGNOSIS — Z1231 Encounter for screening mammogram for malignant neoplasm of breast: Secondary | ICD-10-CM

## 2024-06-17 ENCOUNTER — Other Ambulatory Visit: Payer: Self-pay | Admitting: Internal Medicine

## 2024-06-17 DIAGNOSIS — Z1231 Encounter for screening mammogram for malignant neoplasm of breast: Secondary | ICD-10-CM

## 2024-08-16 ENCOUNTER — Ambulatory Visit
Admission: RE | Admit: 2024-08-16 | Discharge: 2024-08-16 | Disposition: A | Discharge status: Home / Self Care | Source: Ambulatory Visit | Attending: Internal Medicine | Admitting: Internal Medicine

## 2024-08-16 DIAGNOSIS — Z1231 Encounter for screening mammogram for malignant neoplasm of breast: Secondary | ICD-10-CM
